# Patient Record
Sex: Male | Born: 1995 | Race: White | Hispanic: No | Marital: Single | Smoking: Never smoker
Health system: Southern US, Community
[De-identification: ages and names within clinical notes are randomized; demographics above are authoritative.]

## PROBLEM LIST (undated history)

## (undated) DIAGNOSIS — F913 Oppositional defiant disorder: Secondary | ICD-10-CM

## (undated) DIAGNOSIS — F909 Attention-deficit hyperactivity disorder, unspecified type: Secondary | ICD-10-CM

## (undated) HISTORY — DX: Oppositional defiant disorder: F91.3

## (undated) HISTORY — DX: Attention-deficit hyperactivity disorder, unspecified type: F90.9

---
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
## 2021-03-12 ENCOUNTER — Emergency Department (HOSPITAL_BASED_OUTPATIENT_CLINIC_OR_DEPARTMENT_OTHER): Payer: Managed Care, Other (non HMO)

## 2021-03-12 ENCOUNTER — Emergency Department (HOSPITAL_BASED_OUTPATIENT_CLINIC_OR_DEPARTMENT_OTHER)
Admission: EM | Admit: 2021-03-12 | Discharge: 2021-03-12 | Disposition: A | Discharge status: Home / Self Care | Payer: Managed Care, Other (non HMO) | Attending: Emergency Medicine | Admitting: Emergency Medicine

## 2021-03-12 ENCOUNTER — Emergency Department (HOSPITAL_BASED_OUTPATIENT_CLINIC_OR_DEPARTMENT_OTHER): Payer: Managed Care, Other (non HMO) | Admitting: Radiology

## 2021-03-12 ENCOUNTER — Encounter (HOSPITAL_BASED_OUTPATIENT_CLINIC_OR_DEPARTMENT_OTHER): Payer: Self-pay

## 2021-03-12 ENCOUNTER — Other Ambulatory Visit: Payer: Self-pay

## 2021-03-12 DIAGNOSIS — M545 Low back pain, unspecified: Secondary | ICD-10-CM | POA: Insufficient documentation

## 2021-03-12 DIAGNOSIS — Y92410 Unspecified street and highway as the place of occurrence of the external cause: Secondary | ICD-10-CM | POA: Diagnosis not present

## 2021-03-12 DIAGNOSIS — R0789 Other chest pain: Secondary | ICD-10-CM | POA: Insufficient documentation

## 2021-03-12 LAB — URINALYSIS, ROUTINE W REFLEX MICROSCOPIC
Bilirubin Urine: NEGATIVE
Glucose, UA: NEGATIVE mg/dL
Hgb urine dipstick: NEGATIVE
Ketones, ur: NEGATIVE mg/dL
Leukocytes,Ua: NEGATIVE
Nitrite: NEGATIVE
Protein, ur: NEGATIVE mg/dL
Specific Gravity, Urine: 1.018 (ref 1.005–1.030)
pH: 7 (ref 5.0–8.0)

## 2021-03-12 LAB — COMPREHENSIVE METABOLIC PANEL
ALT: 23 U/L (ref 0–44)
AST: 34 U/L (ref 15–41)
Albumin: 4.4 g/dL (ref 3.5–5.0)
Alkaline Phosphatase: 69 U/L (ref 38–126)
Anion gap: 8 (ref 5–15)
BUN: 11 mg/dL (ref 6–20)
CO2: 28 mmol/L (ref 22–32)
Calcium: 9.3 mg/dL (ref 8.9–10.3)
Chloride: 101 mmol/L (ref 98–111)
Creatinine, Ser: 0.9 mg/dL (ref 0.61–1.24)
GFR, Estimated: >60 mL/min (ref >60)
Glucose, Bld: 93 mg/dL (ref 70–99)
Potassium: 3.6 mmol/L (ref 3.5–5.1)
Sodium: 137 mmol/L (ref 135–145)
Total Bilirubin: 0.3 mg/dL (ref 0.3–1.2)
Total Protein: 7.3 g/dL (ref 6.5–8.1)

## 2021-03-12 LAB — CBC
HCT: 43.1 % (ref 39.0–52.0)
Hemoglobin: 14.3 g/dL (ref 13.0–17.0)
MCH: 29.7 pg (ref 26.0–34.0)
MCHC: 33.2 g/dL (ref 30.0–36.0)
MCV: 89.6 fL (ref 80.0–100.0)
Platelets: 278 10*3/uL (ref 150–400)
RBC: 4.81 MIL/uL (ref 4.22–5.81)
RDW: 12.6 % (ref 11.5–15.5)
WBC: 9.9 10*3/uL (ref 4.0–10.5)
nRBC: 0 % (ref 0.0–0.2)

## 2021-03-12 MED ORDER — IBUPROFEN 400 MG PO TABS
600.0000 mg | ORAL_TABLET | Freq: Once | ORAL | Status: AC
  Administered 2021-03-12: 600 mg via ORAL
  Filled 2021-03-12: qty 1

## 2021-03-12 NOTE — ED Notes (Signed)
Patient returned from Radiology at this Time. 
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
## 2021-03-12 NOTE — ED Notes (Signed)
This RN presented the AVS utilizing Teachback Method. Patient verbalizes understanding of Discharge Instructions. Opportunity for Questioning and Answers were provided. Patient Discharged from ED ambulatory to Home via Self.   

## 2021-03-12 NOTE — ED Notes (Signed)
Patient transported to Radiology at this Time. 
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
## 2021-03-12 NOTE — ED Notes (Signed)
EDP at Bedside 

## 2021-03-12 NOTE — Discharge Instructions (Signed)
Continue to treat pain with ibuprofen and Tylenol.  You will likely be more sore tomorrow than you are today.  Please return to the ED for any new concerning symptoms.
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
